# Patient Record
Sex: Female | Born: 1990 | Race: White | Hispanic: No | Marital: Single | Smoking: Never smoker
Health system: Southern US, Community
[De-identification: ages and names within clinical notes are randomized; demographics above are authoritative.]

---

## 2015-03-04 ENCOUNTER — Encounter (HOSPITAL_COMMUNITY): Payer: Self-pay | Admitting: Emergency Medicine

## 2015-03-04 ENCOUNTER — Emergency Department (HOSPITAL_COMMUNITY): Payer: 59

## 2015-03-04 ENCOUNTER — Emergency Department (HOSPITAL_COMMUNITY)
Admission: EM | Admit: 2015-03-04 | Discharge: 2015-03-04 | Disposition: A | Discharge status: Home / Self Care | Payer: 59 | Attending: Emergency Medicine | Admitting: Emergency Medicine

## 2015-03-04 DIAGNOSIS — S0181XA Laceration without foreign body of other part of head, initial encounter: Secondary | ICD-10-CM | POA: Diagnosis not present

## 2015-03-04 DIAGNOSIS — Y998 Other external cause status: Secondary | ICD-10-CM | POA: Insufficient documentation

## 2015-03-04 DIAGNOSIS — Y9355 Activity, bike riding: Secondary | ICD-10-CM | POA: Insufficient documentation

## 2015-03-04 DIAGNOSIS — S0990XA Unspecified injury of head, initial encounter: Secondary | ICD-10-CM | POA: Diagnosis present

## 2015-03-04 DIAGNOSIS — Z23 Encounter for immunization: Secondary | ICD-10-CM | POA: Diagnosis not present

## 2015-03-04 DIAGNOSIS — S060X1A Concussion with loss of consciousness of 30 minutes or less, initial encounter: Secondary | ICD-10-CM | POA: Diagnosis not present

## 2015-03-04 DIAGNOSIS — S42001A Fracture of unspecified part of right clavicle, initial encounter for closed fracture: Secondary | ICD-10-CM

## 2015-03-04 DIAGNOSIS — S025XXA Fracture of tooth (traumatic), initial encounter for closed fracture: Secondary | ICD-10-CM | POA: Insufficient documentation

## 2015-03-04 DIAGNOSIS — T1490XA Injury, unspecified, initial encounter: Secondary | ICD-10-CM

## 2015-03-04 DIAGNOSIS — S80812A Abrasion, left lower leg, initial encounter: Secondary | ICD-10-CM | POA: Diagnosis not present

## 2015-03-04 DIAGNOSIS — S80811A Abrasion, right lower leg, initial encounter: Secondary | ICD-10-CM | POA: Diagnosis not present

## 2015-03-04 DIAGNOSIS — Y92410 Unspecified street and highway as the place of occurrence of the external cause: Secondary | ICD-10-CM | POA: Diagnosis not present

## 2015-03-04 DIAGNOSIS — Z3202 Encounter for pregnancy test, result negative: Secondary | ICD-10-CM | POA: Insufficient documentation

## 2015-03-04 DIAGNOSIS — M25569 Pain in unspecified knee: Secondary | ICD-10-CM

## 2015-03-04 LAB — CBC WITH DIFFERENTIAL/PLATELET
Basophils Absolute: 0 10*3/uL (ref 0.0–0.1)
Basophils Relative: 0 % (ref 0–1)
EOS ABS: 0.2 10*3/uL (ref 0.0–0.7)
Eosinophils Relative: 2 % (ref 0–5)
HCT: 39.2 % (ref 36.0–46.0)
Hemoglobin: 13.1 g/dL (ref 12.0–15.0)
LYMPHS ABS: 5 10*3/uL — AB (ref 0.7–4.0)
LYMPHS PCT: 53 % — AB (ref 12–46)
MCH: 30.4 pg (ref 26.0–34.0)
MCHC: 33.4 g/dL (ref 30.0–36.0)
MCV: 91 fL (ref 78.0–100.0)
Monocytes Absolute: 0.6 10*3/uL (ref 0.1–1.0)
Monocytes Relative: 6 % (ref 3–12)
Neutro Abs: 3.7 10*3/uL (ref 1.7–7.7)
Neutrophils Relative %: 39 % — ABNORMAL LOW (ref 43–77)
Platelets: 329 10*3/uL (ref 150–400)
RBC: 4.31 MIL/uL (ref 3.87–5.11)
RDW: 12.9 % (ref 11.5–15.5)
WBC: 9.5 10*3/uL (ref 4.0–10.5)

## 2015-03-04 LAB — BASIC METABOLIC PANEL
ANION GAP: 5 (ref 5–15)
BUN: 11 mg/dL (ref 6–23)
CHLORIDE: 104 mmol/L (ref 96–112)
CO2: 28 mmol/L (ref 19–32)
CREATININE: 0.82 mg/dL (ref 0.50–1.10)
Calcium: 9 mg/dL (ref 8.4–10.5)
GFR calc non Af Amer: >90 mL/min (ref >90)
Glucose, Bld: 119 mg/dL — ABNORMAL HIGH (ref 70–99)
Potassium: 4.1 mmol/L (ref 3.5–5.1)
SODIUM: 137 mmol/L (ref 135–145)

## 2015-03-04 LAB — TYPE AND SCREEN
ABO/RH(D): O POS
ANTIBODY SCREEN: NEGATIVE

## 2015-03-04 LAB — I-STAT BETA HCG BLOOD, ED (MC, WL, AP ONLY): I-stat hCG, quantitative: <5 m[IU]/mL (ref <5)

## 2015-03-04 LAB — ABO/RH: ABO/RH(D): O POS

## 2015-03-04 MED ORDER — TETANUS-DIPHTH-ACELL PERTUSSIS 5-2.5-18.5 LF-MCG/0.5 IM SUSP
0.5000 mL | Freq: Once | INTRAMUSCULAR | Status: AC
  Administered 2015-03-04: 0.5 mL via INTRAMUSCULAR
  Filled 2015-03-04: qty 0.5

## 2015-03-04 MED ORDER — MORPHINE SULFATE 4 MG/ML IJ SOLN
4.0000 mg | Freq: Once | INTRAMUSCULAR | Status: AC
  Administered 2015-03-04: 4 mg via INTRAVENOUS
  Filled 2015-03-04: qty 1

## 2015-03-04 MED ORDER — TETANUS-DIPHTH-ACELL PERTUSSIS 5-2.5-18.5 LF-MCG/0.5 IM SUSP: 0.5000 mL | Freq: Once | INTRAMUSCULAR | Status: AC

## 2015-03-04 MED ORDER — HYDROCODONE-ACETAMINOPHEN 5-325 MG PO TABS: 1.0000 | ORAL_TABLET | Freq: Four times a day (QID) | ORAL | Status: AC | PRN

## 2015-03-04 MED ORDER — NAPROXEN 500 MG PO TABS: 500.0000 mg | ORAL_TABLET | Freq: Two times a day (BID) | ORAL | Status: AC

## 2015-03-04 MED ORDER — FENTANYL CITRATE 0.05 MG/ML IJ SOLN: 50.0000 ug | Freq: Once | INTRAMUSCULAR | Status: DC

## 2015-03-04 MED ORDER — LIDOCAINE-EPINEPHRINE (PF) 2 %-1:200000 IJ SOLN
20.0000 mL | Freq: Once | INTRAMUSCULAR | Status: AC
  Administered 2015-03-04: 20 mL

## 2015-03-04 NOTE — ED Notes (Signed)
Pt ambulated from room to nurses' station around the corner towards trauma A room and then back to her assigned room without difficulty or distress; visitors arrived and now are in room

## 2015-03-04 NOTE — ED Provider Notes (Signed)
  Physical Exam  BP 114/67 mmHg  Pulse 51  Temp(Src) 98 F (36.7 C) (Oral)  Resp 23  SpO2 100%  LMP 02/14/2015  Physical Exam R forehead hematoma and jagged laceration approx 5-6cm in length and extending down to fascia without severing vessels or tendons, no visible FBs L under-chin laceration, straight, approx 2cm in length, fairly superficial, without FBs  ED Course  LACERATION REPAIR Date/Time: 03/04/2015 10:18 AM Performed by: Allen DerryAMPRUBI-SOMS, Raena Pau Authorized by: Allen DerryAMPRUBI-SOMS, Jai Bear Consent: Verbal consent obtained. Risks and benefits: risks, benefits and alternatives were discussed Consent given by: patient Patient understanding: patient states understanding of the procedure being performed Patient consent: the patient's understanding of the procedure matches consent given Patient identity confirmed: verbally with patient Body area: head/neck Location details: chin Wound length (cm): 5-6cm forehead; 2cm chin. Foreign bodies: no foreign bodies Tendon involvement: none Nerve involvement: none Vascular damage: no Anesthesia: local infiltration Local anesthetic: lidocaine 1% with epinephrine Anesthetic total: 5 ml Patient sedated: no Preparation: Patient was prepped and draped in the usual sterile fashion. Irrigation solution: saline Irrigation method: syringe Amount of cleaning: extensive Debridement: none Degree of undermining: none Skin closure: 5-0 Prolene Number of sutures: 10 (6 in forehead, 4 in chin) Technique: horizontal mattress and simple Approximation: close Approximation difficulty: complex Dressing: antibiotic ointment and 4x4 sterile gauze Patient tolerance: Patient tolerated the procedure well with no immediate complications        Laurie Cornia Camprubi-Soms, Laurie David 03/04/15 1020  [name], MD 03/05/15 2046

## 2015-03-04 NOTE — ED Notes (Signed)
PA at bedside.
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
## 2015-03-04 NOTE — ED Notes (Signed)
Pt is out of the department for testing 

## 2015-03-04 NOTE — ED Notes (Signed)
Patient transported to X-ray 

## 2015-03-04 NOTE — ED Notes (Signed)
Dr. Docherty at the bedside.  

## 2015-03-04 NOTE — Discharge Instructions (Signed)
Keep wound and clean with mild soap and water. Keep area covered with a topical antibiotic ointment and bandage, keep bandage dry, and do not submerge in water for 24 hours. Ice and elevate for additional pain relief and swelling. Follow up with your primary care doctor/campus clinic or the [facility] in approximately 7-10 days for wound recheck and suture removal. Monitor area for signs of infection to include, but not limited to: increasing pain, redness, drainage/pus, or swelling. Return to emergency department for emergent changing or worsening symptoms.    Concussion A concussion, or closed-head injury, is a brain injury caused by a direct blow to the head or by a quick and sudden movement (jolt) of the head or neck. Concussions are usually not life-threatening. Even so, the effects of a concussion can be serious. If you have had a concussion before, you are more likely to experience concussion-like symptoms after a direct blow to the head.  CAUSES  Direct blow to the head, such as from running into another player during a soccer game, being hit in a fight, or hitting your head on a hard surface.  A jolt of the head or neck that causes the brain to move back and forth inside the skull, such as in a car crash. SIGNS AND SYMPTOMS The signs of a concussion can be hard to notice. Early on, they may be missed by you, family members, and health care providers. You may look fine but act or feel differently. Symptoms are usually temporary, but they may last for days, weeks, or even longer. Some symptoms may appear right away while others may not show up for hours or days. Every head injury is different. Symptoms include:  Mild to moderate headaches that will not go away.  A feeling of pressure inside your head.  Having more trouble than usual:  Learning or remembering things you have heard.  Answering questions.  Paying attention or concentrating.  Organizing daily  tasks.  Making decisions and solving problems.  Slowness in thinking, acting or reacting, speaking, or reading.  Getting lost or being easily confused.  Feeling tired all the time or lacking energy (fatigued).  Feeling drowsy.  Sleep disturbances.  Sleeping more than usual.  Sleeping less than usual.  Trouble falling asleep.  Trouble sleeping (insomnia).  Loss of balance or feeling lightheaded or dizzy.  Nausea or vomiting.  Numbness or tingling.  Increased sensitivity to:  Sounds.  Lights.  Distractions.  Vision problems or eyes that tire easily.  Diminished sense of taste or smell.  Ringing in the ears.  Mood changes such as feeling sad or anxious.  Becoming easily irritated or angry for little or no reason.  Lack of motivation.  Seeing or hearing things other people do not see or hear (hallucinations). DIAGNOSIS Your health care provider can usually diagnose a concussion based on a description of your injury and symptoms. He or she will ask whether you passed out (lost consciousness) and whether you are having trouble remembering events that happened right before and during your injury. Your evaluation might include:  A brain scan to look for signs of injury to the brain. Even if the test shows no injury, you may still have a concussion.  Blood tests to be sure other problems are not present. TREATMENT  Concussions are usually treated in an emergency department, in urgent care, or at a clinic. You may need to stay in the hospital overnight for further treatment.  Tell your  health care provider if you are taking any medicines, including prescription medicines, over-the-counter medicines, and natural remedies. Some medicines, such as blood thinners (anticoagulants) and aspirin, may increase the chance of complications. Also tell your health care provider whether you have had alcohol or are taking illegal drugs. This information may affect  treatment.  Your health care provider will send you home with important instructions to follow.  How fast you will recover from a concussion depends on many factors. These factors include how severe your concussion is, what part of your brain was injured, your age, and how healthy you were before the concussion.  Most people with mild injuries recover fully. Recovery can take time. In general, recovery is slower in older persons. Also, persons who have had a concussion in the past or have other medical problems may find that it takes longer to recover from their current injury. HOME CARE INSTRUCTIONS General Instructions  Carefully follow the directions your health care provider gave you.  Only take over-the-counter or prescription medicines for pain, discomfort, or fever as directed by your health care provider.  Take only those medicines that your health care provider has approved.  Do not drink alcohol until your health care provider says you are well enough to do so. Alcohol and certain other drugs may slow your recovery and can put you at risk of further injury.  If it is harder than usual to remember things, write them down.  If you are easily distracted, try to do one thing at a time. For example, do not try to watch TV while fixing dinner.  Talk with family members or close friends when making important decisions.  Keep all follow-up appointments. Repeated evaluation of your symptoms is recommended for your recovery.  Watch your symptoms and tell others to do the same. Complications sometimes occur after a concussion. Older adults with a brain injury may have a higher risk of serious complications, such as a blood clot on the brain.  Tell your teachers, school nurse, school counselor, coach, athletic trainer, or work Production designer, theatre/television/film about your injury, symptoms, and restrictions. Tell them about what you can or cannot do. They should watch for:  Increased problems with attention or  concentration.  Increased difficulty remembering or learning new information.  Increased time needed to complete tasks or assignments.  Increased irritability or decreased ability to cope with stress.  Increased symptoms.  Rest. Rest helps the brain to heal. Make sure you:  Get plenty of sleep at night. Avoid staying up late at night.  Keep the same bedtime hours on weekends and weekdays.  Rest during the day. Take daytime naps or rest breaks when you feel tired.  Limit activities that require a lot of thought or concentration. These include:  Doing homework or job-related work.  Watching TV.  Working on the computer.  Avoid any situation where there is potential for another head injury (football, hockey, soccer, basketball, martial arts, downhill snow sports and horseback riding). Your condition will get worse every time you experience a concussion. You should avoid these activities until you are evaluated by the appropriate follow-up health care providers. Returning To Your Regular Activities You will need to return to your normal activities slowly, not all at once. You must give your body and brain enough time for recovery.  Do not return to sports or other athletic activities until your health care provider tells you it is safe to do so.  Ask your health care provider when you can  drive, ride a bicycle, or operate heavy machinery. Your ability to react may be slower after a brain injury. Never do these activities if you are dizzy.  Ask your health care provider about when you can return to work or school. Preventing Another Concussion It is very important to avoid another brain injury, especially before you have recovered. In rare cases, another injury can lead to permanent brain damage, brain swelling, or death. The risk of this is greatest during the first 7-10 days after a head injury. Avoid injuries by:  Wearing a seat belt when riding in a car.  Drinking alcohol only  in moderation.  Wearing a helmet when biking, skiing, skateboarding, skating, or doing similar activities.  Avoiding activities that could lead to a second concussion, such as contact or recreational sports, until your health care provider says it is okay.  Taking safety measures in your home.  Remove clutter and tripping hazards from floors and stairways.  Use grab bars in bathrooms and handrails by stairs.  Place non-slip mats on floors and in bathtubs.  Improve lighting in dim areas. SEEK MEDICAL CARE IF:  You have increased problems paying attention or concentrating.  You have increased difficulty remembering or learning new information.  You need more time to complete tasks or assignments than before.  You have increased irritability or decreased ability to cope with stress.  You have more symptoms than before. Seek medical care if you have any of the following symptoms for more than 2 weeks after your injury:  Lasting (chronic) headaches.  Dizziness or balance problems.  Nausea.  Vision problems.  Increased sensitivity to noise or light.  Depression or mood swings.  Anxiety or irritability.  Memory problems.  Difficulty concentrating or paying attention.  Sleep problems.  Feeling tired all the time. SEEK IMMEDIATE MEDICAL CARE IF:  You have severe or worsening headaches. These may be a sign of a blood clot in the brain.  You have weakness (even if only in one hand, leg, or part of the face).  You have numbness.  You have decreased coordination.  You vomit repeatedly.  You have increased sleepiness.  One pupil is larger than the other.  You have convulsions.  You have slurred speech.  You have increased confusion. This may be a sign of a blood clot in the brain.  You have increased restlessness, agitation, or irritability.  You are unable to recognize people or places.  You have neck pain.  It is difficult to wake you up.  You have  unusual behavior changes.  You lose consciousness. MAKE SURE YOU:  Understand these instructions.  Will watch your condition.  Will get help right away if you are not doing well or get worse. Document Released: 02/05/2004 Document Revised: 11/20/2013 Document Reviewed: 06/07/2013 [facility] Patient Information 2015 [place], [place]. This information is not intended to replace advice given to you by your health care provider. Make sure you discuss any questions you have with your health care provider.   Acromioclavicular Injuries The AC (acromioclavicular) joint is the joint in the shoulder where the collarbone (clavicle) meets the shoulder blade (scapula). The part of the shoulder blade connected to the collarbone is called the acromion. Common problems with and treatments for the [facility] joint are detailed below. ARTHRITIS Arthritis occurs when the joint has been injured and the smooth padding between the joints (cartilage) is lost. This is the wear and tear seen in most joints of the body if they have been overused.  This causes the joint to produce pain and swelling which is worse with activity.  AC JOINT SEPARATION AC joint separation means that the ligaments connecting the acromion of the shoulder blade and collarbone have been damaged, and the two bones no longer line up. AC separations can be anywhere from mild to severe, and are "graded" depending upon which ligaments are torn and how badly they are torn.  Grade I Injury: the least damage is done, and the [facility] joint still lines up.  Grade II Injury: damage to the ligaments which reinforce the [facility] joint. In a Grade II injury, these ligaments are stretched but not entirely torn. When stressed, the [facility] joint becomes painful and unstable.  Grade III Injury: AC and secondary ligaments are completely torn, and the collarbone is no longer attached to the shoulder blade. This results in deformity; a prominence of the end of the clavicle. AC JOINT  FRACTURE AC joint fracture means that there has been a break in the bones of the [facility] joint, usually the end of the clavicle. TREATMENT TREATMENT OF AC ARTHRITIS  There is currently no way to replace the cartilage damaged by arthritis. The best way to improve the condition is to decrease the activities which aggravate the problem. Application of ice to the joint helps decrease pain and soreness (inflammation). The use of non-steroidal anti-inflammatory medication is helpful.  If less conservative measures do not work, then cortisone shots (injections) may be used. These are anti-inflammatories; they decrease the soreness in the joint and swelling.  If non-surgical measures fail, surgery may be recommended. The procedure is generally removal of a portion of the end of the clavicle. This is the part of the collarbone closest to your acromion which is stabilized with ligaments to the acromion of the shoulder blade. This surgery may be performed using a tube-like instrument with a light (arthroscope) for looking into a joint. It may also be performed as an open surgery through a small incision by the surgeon. Most patients will have good range of motion within 6 weeks and may return to all activity including sports by 8-12 weeks, barring complications. TREATMENT OF AN AC SEPARATION  The initial treatment is to decrease pain. This is best accomplished by immobilizing the arm in a sling and placing an ice pack to the shoulder for 20 to 30 minutes every 2 hours as needed. As the pain starts to subside, it is important to begin moving the fingers, wrist, elbow and eventually the shoulder in order to prevent a stiff or "frozen" shoulder. Instruction on when and how much to move the shoulder will be provided by your caregiver. The length of time needed to regain full motion and function depends on the amount or grade of the injury. Recovery from a Grade I AC separation usually takes 10 to 14 days, whereas a Grade  III may take 6 to 8 weeks.  Grade I and II separations usually do not require surgery. Even Grade III injuries usually allow return to full activity with few restrictions. Treatment is also based on the activity demands of the injured shoulder. For example, a high level quarterback with an injured throwing arm will receive more aggressive treatment than someone with a desk job who rarely uses his/her arm for strenuous activities. In some cases, a painful lump may persist which could require a later surgery. Surgery can be very successful, but the benefits must be weighed against the potential risks. TREATMENT OF AN AC JOINT FRACTURE Fracture treatment  depends on the type of fracture. Sometimes a splint or sling may be all that is required. Other times surgery may be required for repair. This is more frequently the case when the ligaments supporting the clavicle are completely torn. Your caregiver will help you with these decisions and together you can decide what will be the best treatment. HOME CARE INSTRUCTIONS   Apply ice to the injury for 15-20 minutes each hour while awake for 2 days. Put the ice in a plastic bag and place a towel between the bag of ice and skin.  If a sling has been applied, wear it constantly for as long as directed by your caregiver, even at night. The sling or splint can be removed for bathing or showering or as directed. Be sure to keep the shoulder in the same place as when the sling is on. Do not lift the arm.  If a figure-of-eight splint has been applied it should be tightened gently by another person every day. Tighten it enough to keep the shoulders held back. Allow enough room to place the index finger between the body and strap. Loosen the splint immediately if there is numbness or tingling in the hands.  Take over-the-counter or prescription medicines for pain, discomfort or fever as directed by your caregiver.  If you or your child has received a follow up  appointment, it is very important to keep that appointment in order to avoid long term complications, chronic pain or disability. SEEK MEDICAL CARE IF:   The pain is not relieved with medications.  There is increased swelling or discoloration that continues to get worse rather than better.  You or your child has been unable to follow up as instructed.  There is progressive numbness and tingling in the arm, forearm or hand. SEEK IMMEDIATE MEDICAL CARE IF:   The arm is numb, cold or pale.  There is increasing pain in the hand, forearm or fingers. MAKE SURE YOU:   Understand these instructions.  Will watch your condition.  Will get help right away if you are not doing well or get worse. Document Released: 08/25/2005 Document Revised: 02/07/2012 Document Reviewed: 02/17/2009 [facility] Patient Information 2015 [place], [place]. This information is not intended to replace advice given to you by your health care provider. Make sure you discuss any questions you have with your health care provider. Facial Laceration  A facial laceration is a cut on the face. These injuries can be painful and cause bleeding. Lacerations usually heal quickly, but they need special care to reduce scarring. DIAGNOSIS  Your health care provider will take a medical history, ask for details about how the injury occurred, and examine the wound to determine how deep the cut is. TREATMENT  Some facial lacerations may not require closure. Others may not be able to be closed because of an increased risk of infection. The risk of infection and the chance for successful closure will depend on various factors, including the amount of time since the injury occurred. The wound may be cleaned to help prevent infection. If closure is appropriate, pain medicines may be given if needed. Your health care provider will use stitches (sutures), wound glue (adhesive), or skin adhesive strips to repair the laceration. These tools bring the  skin edges together to allow for faster healing and a better cosmetic outcome. If needed, you may also be given a tetanus shot. HOME CARE INSTRUCTIONS  Only take over-the-counter or prescription medicines as directed by your health care provider.  Follow your health care provider's instructions for wound care. These instructions will vary depending on the technique used for closing the wound. For Sutures:  Keep the wound clean and dry.   If you were given a bandage (dressing), you should change it at least once a day. Also change the dressing if it becomes wet or dirty, or as directed by your health care provider.   Wash the wound with soap and water 2 times a day. Rinse the wound off with water to remove all soap. Pat the wound dry with a clean towel.   After cleaning, apply a thin layer of the antibiotic ointment recommended by your health care provider. This will help prevent infection and keep the dressing from sticking.   You may shower as usual after the first 24 hours. Do not soak the wound in water until the sutures are removed.   Get your sutures removed as directed by your health care provider. With facial lacerations, sutures should usually be taken out after 4-5 days to avoid stitch marks.   Wait a few days after your sutures are removed before applying any makeup. For Skin Adhesive Strips:  Keep the wound clean and dry.   Do not get the skin adhesive strips wet. You may bathe carefully, using caution to keep the wound dry.   If the wound gets wet, pat it dry with a clean towel.   Skin adhesive strips will fall off on their own. You may trim the strips as the wound heals. Do not remove skin adhesive strips that are still stuck to the wound. They will fall off in time.  For Wound Adhesive:  You may briefly wet your wound in the shower or bath. Do not soak or scrub the wound. Do not swim. Avoid periods of heavy sweating until the skin adhesive has fallen off on its  own. After showering or bathing, gently pat the wound dry with a clean towel.   Do not apply liquid medicine, cream medicine, ointment medicine, or makeup to your wound while the skin adhesive is in place. This may loosen the film before your wound is healed.   If a dressing is placed over the wound, be careful not to apply tape directly over the skin adhesive. This may cause the adhesive to be pulled off before the wound is healed.   Avoid prolonged exposure to sunlight or tanning lamps while the skin adhesive is in place.  The skin adhesive will usually remain in place for 5-10 days, then naturally fall off the skin. Do not pick at the adhesive film.  After Healing: Once the wound has healed, cover the wound with sunscreen during the day for 1 full year. This can help minimize scarring. Exposure to ultraviolet light in the first year will darken the scar. It can take 1-2 years for the scar to lose its redness and to heal completely.  SEEK IMMEDIATE MEDICAL CARE IF:  You have redness, pain, or swelling around the wound.   You see ayellowish-white fluid (pus) coming from the wound.   You have chills or a fever.  MAKE SURE YOU:  Understand these instructions.  Will watch your condition.  Will get help right away if you are not doing well or get worse. Document Released: 12/23/2004 Document Revised: 09/05/2013 Document Reviewed: 06/28/2013 [facility] Patient Information 2015 [place], [place]. This information is not intended to replace advice given to you by your health care provider. Make sure you discuss any questions you have  with your health care provider. ° °

## 2015-03-04 NOTE — ED Notes (Signed)
Crecencio McNikki S, RN in with pt, chaplain at bedside

## 2015-03-04 NOTE — ED Notes (Signed)
Everlena CooperMica, RN transported pt to CT

## 2015-03-04 NOTE — ED Notes (Signed)
Xray at the bedside.
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
